# Patient Record
Sex: Male | Born: 1990 | Hispanic: Yes | Marital: Single | Smoking: Never smoker
Health system: Southern US, Community
[De-identification: ages and names within clinical notes are randomized; demographics above are authoritative.]

## PROBLEM LIST (undated history)

## (undated) DIAGNOSIS — Z87442 Personal history of urinary calculi: Secondary | ICD-10-CM

## (undated) DIAGNOSIS — K760 Fatty (change of) liver, not elsewhere classified: Secondary | ICD-10-CM

## (undated) HISTORY — PX: NO PAST SURGERIES: SHX2092

---

## 2020-01-12 ENCOUNTER — Emergency Department: Payer: Self-pay

## 2020-01-12 ENCOUNTER — Other Ambulatory Visit: Payer: Self-pay

## 2020-01-12 DIAGNOSIS — N23 Unspecified renal colic: Secondary | ICD-10-CM | POA: Insufficient documentation

## 2020-01-12 LAB — CBC
HCT: 50.6 % (ref 39.0–52.0)
Hemoglobin: 18 g/dL — ABNORMAL HIGH (ref 13.0–17.0)
MCH: 32.2 pg (ref 26.0–34.0)
MCHC: 35.6 g/dL (ref 30.0–36.0)
MCV: 90.5 fL (ref 80.0–100.0)
Platelets: 167 10*3/uL (ref 150–400)
RBC: 5.59 MIL/uL (ref 4.22–5.81)
RDW: 11.8 % (ref 11.5–15.5)
WBC: 19 10*3/uL — ABNORMAL HIGH (ref 4.0–10.5)
nRBC: 0 % (ref 0.0–0.2)

## 2020-01-12 LAB — COMPREHENSIVE METABOLIC PANEL
ALT: 132 U/L — ABNORMAL HIGH (ref 0–44)
AST: 59 U/L — ABNORMAL HIGH (ref 15–41)
Albumin: 4.7 g/dL (ref 3.5–5.0)
Alkaline Phosphatase: 65 U/L (ref 38–126)
Anion gap: 11 (ref 5–15)
BUN: 15 mg/dL (ref 6–20)
CO2: 25 mmol/L (ref 22–32)
Calcium: 9.7 mg/dL (ref 8.9–10.3)
Chloride: 102 mmol/L (ref 98–111)
Creatinine, Ser: 1.1 mg/dL (ref 0.61–1.24)
GFR calc Af Amer: >60 mL/min (ref >60)
GFR calc non Af Amer: >60 mL/min (ref >60)
Glucose, Bld: 103 mg/dL — ABNORMAL HIGH (ref 70–99)
Potassium: 4.5 mmol/L (ref 3.5–5.1)
Sodium: 138 mmol/L (ref 135–145)
Total Bilirubin: 0.9 mg/dL (ref 0.3–1.2)
Total Protein: 8.1 g/dL (ref 6.5–8.1)

## 2020-01-12 LAB — URINALYSIS, COMPLETE (UACMP) WITH MICROSCOPIC
Bacteria, UA: NONE SEEN
Bilirubin Urine: NEGATIVE
Glucose, UA: NEGATIVE mg/dL
Ketones, ur: NEGATIVE mg/dL
Leukocytes,Ua: NEGATIVE
Nitrite: NEGATIVE
Protein, ur: 30 mg/dL — AB
RBC / HPF: >50 RBC/hpf — ABNORMAL HIGH (ref 0–5)
Specific Gravity, Urine: 1.023 (ref 1.005–1.030)
pH: 6 (ref 5.0–8.0)

## 2020-01-12 LAB — LIPASE, BLOOD: Lipase: 24 U/L (ref 11–51)

## 2020-01-12 NOTE — ED Triage Notes (Signed)
Pt has left side abd pain.  Sx began this am.  No diarrhea.  Vomited x 2 today.  Pt alert  Interpreter on a stick used in triage.

## 2020-01-13 ENCOUNTER — Emergency Department
Admission: EM | Admit: 2020-01-13 | Discharge: 2020-01-13 | Disposition: A | Discharge status: Home / Self Care | Payer: Self-pay | Attending: Emergency Medicine | Admitting: Emergency Medicine

## 2020-01-13 DIAGNOSIS — N23 Unspecified renal colic: Secondary | ICD-10-CM

## 2020-01-13 MED ORDER — OXYCODONE-ACETAMINOPHEN 5-325 MG PO TABS: 1.0000 | ORAL_TABLET | ORAL | 0 refills | Status: DC | PRN

## 2020-01-13 MED ORDER — TAMSULOSIN HCL 0.4 MG PO CAPS
0.4000 mg | ORAL_CAPSULE | Freq: Every day | ORAL | 0 refills | Status: DC
Start: 2020-01-13 — End: 2020-08-25

## 2020-01-13 NOTE — Discharge Instructions (Signed)
Please return for worse pain fever or vomiting.  Follow-up with the urologist the numbers on the chart.  Take Percocet 1 pill 4 times a day as needed for pain.  Be sure not to drive on it or operate hazardous machinery.  Use the Flomax once a day to help the stone pass. Vuelva si tiene nauseas o vomitos. Valla al urologo que le recomendamos en las instrucciones. Tomepercocet 1 pastilla 4 veces al dia si lo necesita para el dolor. Tome flomax una vez al dia para que pase la piedra. No conduzca cuando tome el percocet.

## 2020-01-13 NOTE — ED Provider Notes (Signed)
[facility] Emergency Department Provider Note   ____________________________________________   First MD Initiated Contact with Patient 01/13/20 [id number]     (approximate)  I have reviewed the triage vital signs and the nursing notes.   HISTORY  Chief Complaint Abdominal Pain   HPI Louis Shah is a 29 y.o. male who reports left-sided abdominal pain beginning this morning.  He vomited twice but had no diarrhea.  Pain was severe when he had it but it is gone now.  Is deep and kind of crampy.         No past medical history on file.  There are no problems to display for this patient.     Prior to Admission medications   Medication Sig Start Date End Date Taking? Authorizing Provider  oxyCODONE-acetaminophen (PERCOCET) 5-325 MG tablet Take 1 tablet by mouth every 4 (four) hours as needed for severe pain. 01/13/20 01/12/21  Arnaldo Natal, MD  tamsulosin (FLOMAX) 0.4 MG CAPS capsule Take 1 capsule (0.4 mg total) by mouth daily. 01/13/20   Arnaldo Natal, MD    Allergies Patient has no known allergies.  No family history on file.  Social History Social History   Tobacco Use  . Smoking status: Not on file  Substance Use Topics  . Alcohol use: Not on file  . Drug use: Not on file    Review of Systems  Constitutional: No fever/chills Eyes: No visual changes. ENT: No sore throat. Cardiovascular: Denies chest pain. Respiratory: Denies shortness of breath. Gastrointestinal: Earlier he had abdominal pain, nausea and vomiting.  No diarrhea.  No constipation. Genitourinary: Uncertain about dysuria. Musculoskeletal: Negative for back pain. Skin: Negative for rash. Neurological: Negative for headaches, focal weakness   ____________________________________________   PHYSICAL EXAM:  VITAL SIGNS: ED Triage Vitals  Enc Vitals Group     BP 01/12/20 1934 (!) 150/94     Pulse Rate 01/12/20 1934 63     Resp 01/12/20 1934 20     Temp  01/12/20 1934 99.2 F (37.3 C)     Temp Source 01/12/20 1934 Oral     SpO2 01/12/20 1934 99 %     Weight 01/12/20 1942 183 lb (83 kg)     Height 01/12/20 1942 5\' 6"  (1.676 m)     Head Circumference --      Peak Flow --      Pain Score 01/12/20 1941 5     Pain Loc --      Pain Edu? --      Excl. in GC? --     Constitutional: Alert and oriented. Well appearing and in no acute distress. Eyes: Conjunctivae are normal.  Head: Atraumatic. Nose: No congestion/rhinnorhea. Mouth/Throat: Mucous membranes are moist.  Oropharynx non-erythematous. Neck: No stridor. Cardiovascular: Normal rate, regular rhythm. Grossly normal heart sounds.  Good peripheral circulation. Respiratory: Normal respiratory effort.  No retractions. Lungs CTAB. Gastrointestinal: Soft and nontender. No distention. No abdominal bruits. No CVA tenderness. Musculoskeletal: No lower extremity tenderness nor edema.   Neurologic:  Normal speech and language. No gross focal neurologic deficits are appreciated.  Skin:  Skin is warm, dry and intact. No rash noted.   ____________________________________________   LABS (all labs ordered are listed, but only abnormal results are displayed)  Labs Reviewed  COMPREHENSIVE METABOLIC PANEL - Abnormal; Notable for the following components:      Result Value   Glucose, Bld 103 (*)    AST 59 (*)    ALT 132 (*)  All other components within normal limits  CBC - Abnormal; Notable for the following components:   WBC 19.0 (*)    Hemoglobin 18.0 (*)    All other components within normal limits  URINALYSIS, COMPLETE (UACMP) WITH MICROSCOPIC - Abnormal; Notable for the following components:   Color, Urine YELLOW (*)    APPearance HAZY (*)    Hgb urine dipstick LARGE (*)    Protein, ur 30 (*)    RBC / HPF >50 (*)    All other components within normal limits  LIPASE, BLOOD   ____________________________________________  EKG     ____________________________________________  RADIOLOGY  ED MD interpretation: CT read by radiology and reviewed by me does show a 6 mm kidney stone proximally with hydronephrosis  Official radiology report(s): CT Renal Stone Study  Result Date: 01/12/2020 CLINICAL DATA:  Left-sided abdominal pain, vomiting EXAM: CT ABDOMEN AND PELVIS WITHOUT CONTRAST TECHNIQUE: Multidetector CT imaging of the abdomen and pelvis was performed following the standard protocol without IV contrast. COMPARISON:  None. FINDINGS: Lower chest: No acute pleural or parenchymal lung disease. Hepatobiliary: No focal liver abnormality is seen. No gallstones, gallbladder wall thickening, or biliary dilatation. Pancreas: Unremarkable. No pancreatic ductal dilatation or surrounding inflammatory changes. Spleen: Normal in size without focal abnormality. Adrenals/Urinary Tract: There is high-grade left-sided obstructive uropathy related to a 6 mm obstructing proximal left ureteral calculus, reference image 50. The right kidney is unremarkable. The adrenals are normal. Bladder is minimally distended with no focal abnormality. Stomach/Bowel: No bowel obstruction or ileus. Normal appendix right lower quadrant. No bowel wall thickening or inflammatory change. Vascular/Lymphatic: No significant vascular findings are present. No enlarged abdominal or pelvic lymph nodes. Reproductive: Prostate is unremarkable. Other: No free fluid or free gas. No abdominal wall hernia. Musculoskeletal: No acute or destructive bony lesions. Reconstructed images demonstrate no additional findings. IMPRESSION: 1. High-grade left-sided obstructive uropathy related to a 6 mm proximal left ureteral calculus. Electronically Signed   By: Sharlet Salina M.D.   On: 01/12/2020 22:08    ____________________________________________   PROCEDURES  Procedure(s) performed (including Critical  Care):  Procedures   ____________________________________________   INITIAL IMPRESSION / ASSESSMENT AND PLAN / ED COURSE  Patient is currently pain-free.  CT does show the 6 mm kidney stone proximally.  Likely will not pass it so large.  I will have the patient follow-up with urology as he may need lithotripsy or something else.  I have given him some Flomax and some Percocet to help with the pain.  Patient will return here as instructed if Percocet is not helping him.   I have also instructed him not to drive or operate hazardous machinery on the Percocet.  I should add that the urinalysis shows red cells but no white cells the patient is not febrile and has no bacteria in his urine therefore I do not believe he requires any antibiotics.  Blood pressure was elevated while he had the pain.  We will need to watch this though.  Likely will come down when the stone passes             ____________________________________________   FINAL CLINICAL IMPRESSION(S) / ED DIAGNOSES  Final diagnoses:  Renal colic     ED Discharge Orders         Ordered    tamsulosin (FLOMAX) 0.4 MG CAPS capsule  Daily        01/13/20 0651    oxyCODONE-acetaminophen (PERCOCET) 5-325 MG tablet  Every 4 hours PRN  01/13/20 5956          *Please note:  Hymie Gorr was evaluated in Emergency Department on 01/13/2020 for the symptoms described in the history of present illness. He was evaluated in the context of the global COVID-19 pandemic, which necessitated consideration that the patient might be at risk for infection with the SARS-CoV-2 virus that causes COVID-19. Institutional protocols and algorithms that pertain to the evaluation of patients at risk for COVID-19 are in a state of rapid change based on information released by regulatory bodies including the CDC and federal and state organizations. These policies and algorithms were followed during the patient's care in the ED.  Some ED  evaluations and interventions may be delayed as a result of limited staffing during and the pandemic.*   Note:  This document was prepared using Dragon voice recognition software and may include unintentional dictation errors.    Arnaldo Natal, MD 01/13/20 [phone]

## 2020-01-13 NOTE — ED Notes (Signed)
Patient discharged to home per MD order. Patient in stable condition, and deemed medically cleared by ED provider for discharge. Discharge instructions reviewed with patient/family using "Teach Back"; verbalized understanding of medication education and administration, and information about follow-up care. Denies further concerns. ° °

## 2020-08-25 ENCOUNTER — Emergency Department
Admission: EM | Admit: 2020-08-25 | Discharge: 2020-08-25 | Disposition: A | Discharge status: Home / Self Care | Payer: PRIVATE HEALTH INSURANCE | Attending: Emergency Medicine | Admitting: Emergency Medicine

## 2020-08-25 ENCOUNTER — Emergency Department: Payer: PRIVATE HEALTH INSURANCE

## 2020-08-25 ENCOUNTER — Encounter: Payer: Self-pay | Admitting: Emergency Medicine

## 2020-08-25 ENCOUNTER — Other Ambulatory Visit: Payer: Self-pay

## 2020-08-25 DIAGNOSIS — S060X1A Concussion with loss of consciousness of 30 minutes or less, initial encounter: Secondary | ICD-10-CM | POA: Diagnosis not present

## 2020-08-25 DIAGNOSIS — Z23 Encounter for immunization: Secondary | ICD-10-CM | POA: Insufficient documentation

## 2020-08-25 DIAGNOSIS — S52101A Unspecified fracture of upper end of right radius, initial encounter for closed fracture: Secondary | ICD-10-CM | POA: Insufficient documentation

## 2020-08-25 DIAGNOSIS — S022XXA Fracture of nasal bones, initial encounter for closed fracture: Secondary | ICD-10-CM | POA: Diagnosis not present

## 2020-08-25 DIAGNOSIS — S0181XA Laceration without foreign body of other part of head, initial encounter: Secondary | ICD-10-CM | POA: Diagnosis not present

## 2020-08-25 DIAGNOSIS — W11XXXA Fall on and from ladder, initial encounter: Secondary | ICD-10-CM | POA: Diagnosis not present

## 2020-08-25 DIAGNOSIS — S0990XA Unspecified injury of head, initial encounter: Secondary | ICD-10-CM | POA: Diagnosis present

## 2020-08-25 LAB — COMPREHENSIVE METABOLIC PANEL
ALT: 83 U/L — ABNORMAL HIGH (ref 0–44)
AST: 45 U/L — ABNORMAL HIGH (ref 15–41)
Albumin: 4.8 g/dL (ref 3.5–5.0)
Alkaline Phosphatase: 59 U/L (ref 38–126)
Anion gap: 9 (ref 5–15)
BUN: 21 mg/dL — ABNORMAL HIGH (ref 6–20)
CO2: 25 mmol/L (ref 22–32)
Calcium: 9.3 mg/dL (ref 8.9–10.3)
Chloride: 106 mmol/L (ref 98–111)
Creatinine, Ser: 0.79 mg/dL (ref 0.61–1.24)
GFR, Estimated: >60 mL/min (ref >60)
Glucose, Bld: 94 mg/dL (ref 70–99)
Potassium: 3.8 mmol/L (ref 3.5–5.1)
Sodium: 140 mmol/L (ref 135–145)
Total Bilirubin: 1 mg/dL (ref 0.3–1.2)
Total Protein: 7.9 g/dL (ref 6.5–8.1)

## 2020-08-25 LAB — CBC WITH DIFFERENTIAL/PLATELET
Abs Immature Granulocytes: 0.05 10*3/uL (ref 0.00–0.07)
Basophils Absolute: 0 10*3/uL (ref 0.0–0.1)
Basophils Relative: 0 %
Eosinophils Absolute: 0.1 10*3/uL (ref 0.0–0.5)
Eosinophils Relative: 1 %
HCT: 48.2 % (ref 39.0–52.0)
Hemoglobin: 16.9 g/dL (ref 13.0–17.0)
Immature Granulocytes: 0 %
Lymphocytes Relative: 14 %
Lymphs Abs: 1.7 10*3/uL (ref 0.7–4.0)
MCH: 30.3 pg (ref 26.0–34.0)
MCHC: 35.1 g/dL (ref 30.0–36.0)
MCV: 86.5 fL (ref 80.0–100.0)
Monocytes Absolute: 0.7 10*3/uL (ref 0.1–1.0)
Monocytes Relative: 6 %
Neutro Abs: 9.9 10*3/uL — ABNORMAL HIGH (ref 1.7–7.7)
Neutrophils Relative %: 79 %
Platelets: 150 10*3/uL (ref 150–400)
RBC: 5.57 MIL/uL (ref 4.22–5.81)
RDW: 12.3 % (ref 11.5–15.5)
WBC: 12.4 10*3/uL — ABNORMAL HIGH (ref 4.0–10.5)
nRBC: 0 % (ref 0.0–0.2)

## 2020-08-25 MED ORDER — ONDANSETRON HCL 4 MG/2ML IJ SOLN
4.0000 mg | Freq: Once | INTRAMUSCULAR | Status: AC
  Administered 2020-08-25: 4 mg via INTRAVENOUS
  Filled 2020-08-25: qty 2

## 2020-08-25 MED ORDER — LIDOCAINE-EPINEPHRINE 2 %-1:100000 IJ SOLN: 20.0000 mL | Freq: Once | INTRAMUSCULAR | Status: DC

## 2020-08-25 MED ORDER — FENTANYL CITRATE (PF) 100 MCG/2ML IJ SOLN
50.0000 ug | Freq: Once | INTRAMUSCULAR | Status: AC
  Administered 2020-08-25: 50 ug via INTRAVENOUS
  Filled 2020-08-25: qty 2

## 2020-08-25 MED ORDER — OXYCODONE HCL 5 MG PO TABS: 5.0000 mg | ORAL_TABLET | Freq: Four times a day (QID) | ORAL | 0 refills | Status: AC | PRN

## 2020-08-25 MED ORDER — LIDOCAINE-EPINEPHRINE 2 %-1:100000 IJ SOLN
20.0000 mL | Freq: Once | INTRAMUSCULAR | Status: AC
  Administered 2020-08-25: 20 mL via INTRADERMAL

## 2020-08-25 MED ORDER — TETANUS-DIPHTH-ACELL PERTUSSIS 5-2.5-18.5 LF-MCG/0.5 IM SUSY
0.5000 mL | PREFILLED_SYRINGE | Freq: Once | INTRAMUSCULAR | Status: AC
  Administered 2020-08-25: 0.5 mL via INTRAMUSCULAR
  Filled 2020-08-25: qty 0.5

## 2020-08-25 NOTE — ED Triage Notes (Signed)
Pt comes into the ED via POV c/o a fall from the roof where he was on a 60ft ladder.  Pt states that he did land on concrete and hit his head.  Pt denies any LOC.  Pt also c/o laceration to the forehead and right wrist pain.  Pt in NAD at is neurologically intact at this time.  Pt denies any neck or back pain currently.  No obvious deformity noted.

## 2020-08-25 NOTE — Discharge Instructions (Addendum)
He mostly has a mild concussion.  Do not do any sports.  Take Tylenol 1 g every 8 hours for pain and use oxycodone for breakthrough pain.  Sutures need removed in 5 days  You have incidentally noted liver function test elevation that you have had previously.  This needs to be followed up with a primary doctor  Nasal fracture:  Minimally displaced LEFT nasal bone fracture. Do not blow your nose.  Follow-up with ENT in 6-10  Wrist fracture: Stay in the splint.  Follow-up with orthopedics IMPRESSION: 1. Acute nondisplaced distal radial fracture with suspected intra-articular extension. 2. Acute minimally displaced triquetral avulsion fracture.  Take oxycodone as prescribed. Do not drink alcohol, drive or participate in any other potentially dangerous activities while taking this medication as it may make you sleepy. Do not take this medication with any other sedating medications, either prescription or over-the-counter. If you were prescribed Percocet or Vicodin, do not take these with acetaminophen (Tylenol) as it is already contained within these medications.  This medication is an opiate (or narcotic) pain medication and can be habit forming. Use it as little as possible to achieve adequate pain control. Do not use or use it with extreme caution if you have a history of opiate abuse or dependence. If you are on a pain contract with your primary care doctor or a pain specialist, be sure to let them know you were prescribed this medication today from the [facility] Emergency Department. This medication is intended for your use only - do not give any to anyone else and keep it in a secure place where nobody else, especially children, have access to it.

## 2020-08-25 NOTE — ED Provider Notes (Addendum)
[facility] Emergency Department Provider Note  ____________________________________________   Event Date/Time   First MD Initiated Contact with Patient 08/25/20 1147     (approximate)  I have reviewed the triage vital signs and the nursing notes.   HISTORY  Chief Complaint Fall    HPI Louis Shah is a 30 y.o. male otherwise healthy comes in for a fall.  Patient was on a ladder when the ladder slid to the right and he fell down onto the ground.  He is unsure exactly how he landed but is only reporting pain in his right lower shin and right wrist and his head.  Possible LOC. Doesn't remember event well.  He denies any chest wall pain, abdominal pain, back pain.  Has been ambulatory since the fall.  He has a small laceration above his right eye.  The pain is constant, worse with movement especially of his wrist, nothing makes it better.  Unsure of his last tetanus     Medical: None   There are no problems to display for this patient.    Prior to Admission medications   Medication Sig Start Date End Date Taking? Authorizing Provider  oxyCODONE-acetaminophen (PERCOCET) 5-325 MG tablet Take 1 tablet by mouth every 4 (four) hours as needed for severe pain. 01/13/20 01/12/21  Arnaldo Natal, MD  tamsulosin (FLOMAX) 0.4 MG CAPS capsule Take 1 capsule (0.4 mg total) by mouth daily. 01/13/20   Arnaldo Natal, MD    Allergies Patient has no known allergies.  No family history on file.  Social History Social History   Tobacco Use  . Smoking status: Never Smoker  . Smokeless tobacco: Never Used  Substance Use Topics  . Alcohol use: Yes    Comment: socially      Review of Systems Constitutional: No fever/chills Eyes: No visual changes. ENT: No sore throat.  Facial injury Cardiovascular: Denies chest pain. Respiratory: Denies shortness of breath. Gastrointestinal: No abdominal pain.  No nausea, no vomiting.  No diarrhea.  No  constipation. Genitourinary: Negative for dysuria. Musculoskeletal: Negative for back pain.  Wrist pain, leg pain Skin: Negative for rash. Neurological: Negative for headaches, focal weakness or numbness. All other ROS negative ____________________________________________   PHYSICAL EXAM:  VITAL SIGNS: ED Triage Vitals  Enc Vitals Group     BP 08/25/20 1143 (!) 130/91     Pulse Rate 08/25/20 1143 85     Resp 08/25/20 1143 18     Temp 08/25/20 1143 98.1 F (36.7 C)     Temp Source 08/25/20 1143 Oral     SpO2 08/25/20 1143 98 %     Weight 08/25/20 1144 180 lb (81.6 kg)     Height 08/25/20 1144 5' 2.99" (1.6 m)     Head Circumference --      Peak Flow --      Pain Score 08/25/20 1143 6     Pain Loc --      Pain Edu? --      Excl. in GC? --     Constitutional: Alert and oriented. Well appearing and in no acute distress. Eyes: Conjunctivae are normal. EOMI. Head: Laceration above the right eye. Nose: No congestion/rhinnorhea. Mouth/Throat: Mucous membranes are moist.   Neck: No stridor. Trachea Midline. FROM Cardiovascular: Normal rate, regular rhythm. Grossly normal heart sounds.  Good peripheral circulation.  No chest wall tenderness Respiratory: Normal respiratory effort.  No retractions. Lungs CTAB. Gastrointestinal: Soft and nontender. No distention. No abdominal bruits.  No abdominal tenderness Musculoskeletal: Small abrasion on the right lower shin.  Full range of motion of joints.  Some tenderness at the right wrist with 2+ distal pulse. Neurologic:  Normal speech and language. No gross focal neurologic deficits are appreciated.  Skin:  Skin is warm, dry and intact. No rash noted. Psychiatric: Mood and affect are normal. Speech and behavior are normal. GU: Deferred  Back: No CTL spine tenderness ____________________________________________   LABS (all labs ordered are listed, but only abnormal results are displayed)  Labs Reviewed  CBC WITH DIFFERENTIAL/PLATELET  - Abnormal; Notable for the following components:      Result Value   WBC 12.4 (*)    Neutro Abs 9.9 (*)    All other components within normal limits  COMPREHENSIVE METABOLIC PANEL - Abnormal; Notable for the following components:   BUN 21 (*)    AST 45 (*)    ALT 83 (*)    All other components within normal limits   ____________________________________________   ED ECG REPORT I, Concha Se, the attending physician, personally viewed and interpreted this ECG.  Normal sinus rate of 97, no ST elevation, T wave version in lead III, normal intervals ____________________________________________  RADIOLOGY Vela Prose, personally viewed and evaluated these images (plain radiographs) as part of my medical decision making, as well as reviewing the written report by the radiologist.  ED MD interpretation: Fracture of wrist  Official radiology report(s): DG Wrist Complete Right  Result Date: 08/25/2020 CLINICAL DATA:  Fall EXAM: RIGHT WRIST - COMPLETE 3+ VIEW COMPARISON:  None. FINDINGS: Acute nondisplaced fracture of the distal radial metaphysis with suspected intra-articular extension at the level of the lunate fossa. Minimally displaced avulsion fracture from the dorsal triquetrum. Osseous structures are otherwise intact. No dislocation. Diffuse soft tissue swelling. IMPRESSION: 1. Acute nondisplaced distal radial fracture with suspected intra-articular extension. 2. Acute minimally displaced triquetral avulsion fracture. Electronically Signed   By: Duanne Guess D.O.   On: 08/25/2020 12:52   DG Tibia/Fibula Right  Result Date: 08/25/2020 CLINICAL DATA:  Fall EXAM: RIGHT TIBIA AND FIBULA - 2 VIEW COMPARISON:  None FINDINGS: Osseous mineralization normal. Joint spaces preserved. No fracture, dislocation, or bone destruction. IMPRESSION: Normal exam. Electronically Signed   By: Ulyses Southward M.D.   On: 08/25/2020 12:51   CT Head Wo Contrast  Result Date: 08/25/2020 CLINICAL DATA:  Larey Seat  from roof from an 8 foot ladder, landed on concrete, struck head, denies loss of consciousness, has frontal laceration, facial trauma, denies neck pain EXAM: CT HEAD WITHOUT CONTRAST CT MAXILLOFACIAL WITHOUT CONTRAST CT CERVICAL SPINE WITHOUT CONTRAST TECHNIQUE: Multidetector CT imaging of the head, cervical spine, and maxillofacial structures were performed using the standard protocol without intravenous contrast. Multiplanar CT image reconstructions of the cervical spine and maxillofacial structures were also generated. Right side of face marked with BB. COMPARISON:  None FINDINGS: CT HEAD FINDINGS Brain: Normal ventricular morphology. No midline shift or mass effect. Normal appearance of brain parenchyma. No intracranial hemorrhage, mass lesion, or evidence of acute infarction. No extra-axial fluid collections. Vascular: No vascular abnormalities Skull: Minimal RIGHT frontal scalp soft tissue swelling. Calvaria intact. Other: N/A CT MAXILLOFACIAL FINDINGS Osseous: Minimally displaced LEFT nasal bone fracture. No additional facial bone fractures identified. TMJ alignment normal bilaterally. Orbits: Bony orbits intact. Intraorbital soft tissue planes clear without fluid or air. Sinuses: Scattered mucosal thickening in ethmoid air cells. Small mucosal retention cyst RIGHT maxillary sinus with small RIGHT maxillary air-fluid level. Mastoid air cells  remaining paranasal sinuses, and middle ear cavities clear. Soft tissues: Small RIGHT supraorbital laceration and soft tissue swelling/contusion. Soft tissue swelling at the nose. No additional soft tissue abnormalities. CT CERVICAL SPINE FINDINGS Alignment: Normal Skull base and vertebrae: Osseous mineralization normal. Vertebral body and disc space heights maintained. Skull base intact. No fracture, subluxation, or bone destruction. Soft tissues and spinal canal: Prevertebral soft tissues normal thickness. Remaining visualized cervical soft tissues unremarkable. Disc  levels:  No specific abnormalities Upper chest: Lung apices clear. Other: N/A IMPRESSION: Normal CT head. Minimally displaced LEFT nasal bone fracture. No additional facial bone abnormalities. Normal CT cervical spine. Electronically Signed   By: Ulyses SouthwardMark  Boles M.D.   On: 08/25/2020 13:18   CT Cervical Spine Wo Contrast  Result Date: 08/25/2020 CLINICAL DATA:  Larey SeatFell from roof from an 8 foot ladder, landed on concrete, struck head, denies loss of consciousness, has frontal laceration, facial trauma, denies neck pain EXAM: CT HEAD WITHOUT CONTRAST CT MAXILLOFACIAL WITHOUT CONTRAST CT CERVICAL SPINE WITHOUT CONTRAST TECHNIQUE: Multidetector CT imaging of the head, cervical spine, and maxillofacial structures were performed using the standard protocol without intravenous contrast. Multiplanar CT image reconstructions of the cervical spine and maxillofacial structures were also generated. Right side of face marked with BB. COMPARISON:  None FINDINGS: CT HEAD FINDINGS Brain: Normal ventricular morphology. No midline shift or mass effect. Normal appearance of brain parenchyma. No intracranial hemorrhage, mass lesion, or evidence of acute infarction. No extra-axial fluid collections. Vascular: No vascular abnormalities Skull: Minimal RIGHT frontal scalp soft tissue swelling. Calvaria intact. Other: N/A CT MAXILLOFACIAL FINDINGS Osseous: Minimally displaced LEFT nasal bone fracture. No additional facial bone fractures identified. TMJ alignment normal bilaterally. Orbits: Bony orbits intact. Intraorbital soft tissue planes clear without fluid or air. Sinuses: Scattered mucosal thickening in ethmoid air cells. Small mucosal retention cyst RIGHT maxillary sinus with small RIGHT maxillary air-fluid level. Mastoid air cells remaining paranasal sinuses, and middle ear cavities clear. Soft tissues: Small RIGHT supraorbital laceration and soft tissue swelling/contusion. Soft tissue swelling at the nose. No additional soft tissue  abnormalities. CT CERVICAL SPINE FINDINGS Alignment: Normal Skull base and vertebrae: Osseous mineralization normal. Vertebral body and disc space heights maintained. Skull base intact. No fracture, subluxation, or bone destruction. Soft tissues and spinal canal: Prevertebral soft tissues normal thickness. Remaining visualized cervical soft tissues unremarkable. Disc levels:  No specific abnormalities Upper chest: Lung apices clear. Other: N/A IMPRESSION: Normal CT head. Minimally displaced LEFT nasal bone fracture. No additional facial bone abnormalities. Normal CT cervical spine. Electronically Signed   By: Ulyses SouthwardMark  Boles M.D.   On: 08/25/2020 13:18   CT Maxillofacial Wo Contrast  Result Date: 08/25/2020 CLINICAL DATA:  Larey SeatFell from roof from an 8 foot ladder, landed on concrete, struck head, denies loss of consciousness, has frontal laceration, facial trauma, denies neck pain EXAM: CT HEAD WITHOUT CONTRAST CT MAXILLOFACIAL WITHOUT CONTRAST CT CERVICAL SPINE WITHOUT CONTRAST TECHNIQUE: Multidetector CT imaging of the head, cervical spine, and maxillofacial structures were performed using the standard protocol without intravenous contrast. Multiplanar CT image reconstructions of the cervical spine and maxillofacial structures were also generated. Right side of face marked with BB. COMPARISON:  None FINDINGS: CT HEAD FINDINGS Brain: Normal ventricular morphology. No midline shift or mass effect. Normal appearance of brain parenchyma. No intracranial hemorrhage, mass lesion, or evidence of acute infarction. No extra-axial fluid collections. Vascular: No vascular abnormalities Skull: Minimal RIGHT frontal scalp soft tissue swelling. Calvaria intact. Other: N/A CT MAXILLOFACIAL FINDINGS Osseous: Minimally displaced LEFT nasal  bone fracture. No additional facial bone fractures identified. TMJ alignment normal bilaterally. Orbits: Bony orbits intact. Intraorbital soft tissue planes clear without fluid or air. Sinuses:  Scattered mucosal thickening in ethmoid air cells. Small mucosal retention cyst RIGHT maxillary sinus with small RIGHT maxillary air-fluid level. Mastoid air cells remaining paranasal sinuses, and middle ear cavities clear. Soft tissues: Small RIGHT supraorbital laceration and soft tissue swelling/contusion. Soft tissue swelling at the nose. No additional soft tissue abnormalities. CT CERVICAL SPINE FINDINGS Alignment: Normal Skull base and vertebrae: Osseous mineralization normal. Vertebral body and disc space heights maintained. Skull base intact. No fracture, subluxation, or bone destruction. Soft tissues and spinal canal: Prevertebral soft tissues normal thickness. Remaining visualized cervical soft tissues unremarkable. Disc levels:  No specific abnormalities Upper chest: Lung apices clear. Other: N/A IMPRESSION: Normal CT head. Minimally displaced LEFT nasal bone fracture. No additional facial bone abnormalities. Normal CT cervical spine. Electronically Signed   By: Ulyses Southward M.D.   On: 08/25/2020 13:18    ____________________________________________   PROCEDURES  Procedure(s) performed (including Critical Care):  Marland KitchenMarland KitchenLaceration Repair  Date/Time: 08/25/2020 2:06 PM Performed by: Concha Se, MD Authorized by: Concha Se, MD   Consent:    Consent obtained:  Verbal   Consent given by:  Patient   Risks, benefits, and alternatives were discussed: yes     Risks discussed:  Infection, nerve damage, need for additional repair, poor cosmetic result, poor wound healing, retained foreign body, tendon damage, vascular damage and pain   Alternatives discussed:  No treatment Universal protocol:    Patient identity confirmed:  Verbally with patient Anesthesia:    Anesthesia method:  Local infiltration   Local anesthetic:  Lidocaine 1% WITH epi Laceration details:    Location:  Face   Face location:  Forehead   Length (cm):  8   Depth (mm):  5 Pre-procedure details:    Preparation:   Patient was prepped and draped in usual sterile fashion Exploration:    Limited defect created (wound extended): no     Hemostasis achieved with:  Epinephrine   Imaging outcome: foreign body not noted     Contaminated: no   Treatment:    Area cleansed with:  Povidone-iodine and saline   Amount of cleaning:  Standard   Debridement:  None Skin repair:    Repair method:  Sutures   Suture size:  6-0   Suture material:  Prolene   Number of sutures:  8 Approximation:    Approximation:  Close     ____________________________________________   INITIAL IMPRESSION / ASSESSMENT AND PLAN / ED COURSE  Louis Shah was evaluated in Emergency Department on 08/25/2020 for the symptoms described in the history of present illness. He was evaluated in the context of the global COVID-19 pandemic, which necessitated consideration that the patient might be at risk for infection with the SARS-CoV-2 virus that causes COVID-19. Institutional protocols and algorithms that pertain to the evaluation of patients at risk for COVID-19 are in a state of rapid change based on information released by regulatory bodies including the CDC and federal and state organizations. These policies and algorithms were followed during the patient's care in the ED.    Patient comes in with significant fall from a ladder.  Differential includes intracranial hemorrhage, cervical fracture, facial fracture, extremity fracture.  No chest wall tenderness or abdominal pain to suggest intrathoracic or abdominal injuries but discussed the patient that if he develops any pain to let us know.  Will get EKG given possible LOC.  We will give some IV fentanyl and Zofran to help treat symptoms   Patient does have slightly increased liver function test but has had this previously.  We will have patient follow this up with his primary care doctor.  2:07 PM evaluation patient has abdomen remains soft nontender remains to have no chest wall  tenderness.  CT scan showed minimally displaced left nasal bone fracture.  Patient has no septal hematoma.  Patient does report some amnesia to the event therefore I suspect that he had a mild concussion.  He is currently alert and oriented  Sutures placed and recommended them removed in 5 days.  Splint placed I did discuss with Hyacinth Meeker from orthopedics who will follow up with them in clinic.  We discussed with patient Tylenol and oxycodone for breakthrough pain and not driving while on it.  Patient also understands to follow-up with ENT for his nasal fracture   Spanish interpreter was used  ____________________________________________   FINAL CLINICAL IMPRESSION(S) / ED DIAGNOSES   Final diagnoses:  Closed fracture of nasal bone, initial encounter  Closed fracture of proximal end of right radius, unspecified fracture morphology, initial encounter  Laceration of forehead, initial encounter  Concussion with loss of consciousness of 30 minutes or less, initial encounter      MEDICATIONS GIVEN DURING THIS VISIT:  Medications  fentaNYL (SUBLIMAZE) injection 50 mcg (50 mcg Intravenous Given 08/25/20 1245)  ondansetron (ZOFRAN) injection 4 mg (4 mg Intravenous Given 08/25/20 1245)  Tdap (BOOSTRIX) injection 0.5 mL (0.5 mLs Intramuscular Given 08/25/20 1244)  lidocaine-EPINEPHrine (XYLOCAINE W/EPI) 2 %-1:100000 (with pres) injection 20 mL (20 mLs Intradermal Given by Other 08/25/20 1311)     ED Discharge Orders    None       Note:  This document was prepared using Dragon voice recognition software and may include unintentional dictation errors.   Concha Se, MD 08/25/20 1409    Concha Se, MD 08/25/20 [phone]

## 2020-08-25 NOTE — ED Notes (Signed)
See triage note  Presents s/p fall from ladder  States he landed on concrete  Laceration/abrasions noted to forehead and nasal area  Also having pain to right wrist area  Good pulses

## 2020-08-31 ENCOUNTER — Emergency Department
Admission: EM | Admit: 2020-08-31 | Discharge: 2020-08-31 | Disposition: A | Discharge status: Home / Self Care | Payer: Self-pay | Attending: Student in an Organized Health Care Education/Training Program | Admitting: Student in an Organized Health Care Education/Training Program

## 2020-08-31 ENCOUNTER — Other Ambulatory Visit: Payer: Self-pay

## 2020-08-31 DIAGNOSIS — Z4802 Encounter for removal of sutures: Secondary | ICD-10-CM | POA: Insufficient documentation

## 2020-08-31 DIAGNOSIS — S01111D Laceration without foreign body of right eyelid and periocular area, subsequent encounter: Secondary | ICD-10-CM | POA: Insufficient documentation

## 2020-08-31 DIAGNOSIS — X58XXXD Exposure to other specified factors, subsequent encounter: Secondary | ICD-10-CM | POA: Insufficient documentation

## 2020-08-31 NOTE — Discharge Instructions (Signed)
Continue to keep area clean and dry.

## 2020-08-31 NOTE — ED Triage Notes (Signed)
Pt here for staple removal from previous visit to ED. Pt A&O with no complaints.

## 2020-08-31 NOTE — ED Provider Notes (Signed)
[facility] Emergency Department Provider Note  ____________________________________________   Event Date/Time   First MD Initiated Contact with Patient 08/31/20 1044     (approximate)  I have reviewed the triage vital signs and the nursing notes.   HISTORY  Chief Complaint Suture / Staple Removal   HPI Louis Shah is a 30 y.o. male presents to the ED for suture removal.  Patient denies any difficulties.       No past medical history on file.  There are no problems to display for this patient.   Prior to Admission medications   Medication Sig Start Date End Date Taking? Authorizing Provider  oxyCODONE (ROXICODONE) 5 MG immediate release tablet Take 1 tablet (5 mg total) by mouth every 6 (six) hours as needed for up to 7 days. Patient taking differently: Take 5 mg by mouth every 6 (six) hours as needed (pain). 08/25/20 09/01/20  Concha Se, MD    Allergies Patient has no known allergies.  No family history on file.  Social History Social History   Tobacco Use  . Smoking status: Never Smoker  . Smokeless tobacco: Never Used  Substance Use Topics  . Alcohol use: Yes    Comment: socially    Review of Systems Constitutional: No fever/chills Eyes: No visual changes. ENT: No sore throat. Cardiovascular: Denies chest pain. Respiratory: Denies shortness of breath. Musculoskeletal: Negative for skeletal pain. Skin: Sutures. Neurological: Negative for headaches, focal weakness or numbness. ____________________________________________   PHYSICAL EXAM:  VITAL SIGNS: ED Triage Vitals  Enc Vitals Group     BP      Pulse      Resp      Temp      Temp src      SpO2      Weight      Height      Head Circumference      Peak Flow      Pain Score      Pain Loc      Pain Edu?      Excl. in GC?     Constitutional: Alert and oriented. Well appearing and in no acute distress. Eyes: Conjunctivae are normal. PERRL. EOMI. Head:  Atraumatic. Neck: No stridor.   Cardiovascular:  Good peripheral circulation. Respiratory: Normal respiratory effort.  No retractions.  Neurologic:  Normal speech and language. No gross focal neurologic deficits are appreciated. No gait instability. Skin:  Skin is warm, dry and intact.  Well-healed laceration site right upper eyebrow.  No drainage or erythema present. Psychiatric: Mood and affect are normal. Speech and behavior are normal.  ____________________________________________   LABS (all labs ordered are listed, but only abnormal results are displayed)  Labs Reviewed - No data to display  PROCEDURES  Procedure(s) performed (including Critical Care):  .Suture Removal  Date/Time: 08/31/2020 12:00 PM Performed by: Tommi Rumps, PA-C Authorized by: Tommi Rumps, PA-C   Consent:    Consent obtained:  Verbal   Consent given by:  Patient Location:    Location:  Head/neck   Head/neck location:  Eyebrow   Eyebrow location:  R eyebrow Procedure details:    Wound appearance:  No signs of infection   Number of sutures removed:  8   Number of staples removed:  8 Post-procedure details:    Post-removal:  No dressing applied   Procedure completion:  Tolerated     ____________________________________________   INITIAL IMPRESSION / ASSESSMENT AND PLAN / ED COURSE  As part of my medical decision making, I reviewed the following data within the electronic MEDICAL RECORD NUMBER Notes from prior ED visits and [place] Controlled Substance Database  30 year old male presents to the ED for suture removal.  Patient was seen in the emergency department on 08/25/2020 for laceration above his right eyebrow.  Area appears to be healing well without any signs of infection.  Sutures were removed.  Patient to continue to keep area clean and dry.  ____________________________________________   FINAL CLINICAL IMPRESSION(S) / ED DIAGNOSES  Final diagnoses:  Encounter for removal of  sutures     ED Discharge Orders    None      *Please note:  Glenwood Revoir was evaluated in Emergency Department on 08/31/2020 for the symptoms described in the history of present illness. He was evaluated in the context of the global COVID-19 pandemic, which necessitated consideration that the patient might be at risk for infection with the SARS-CoV-2 virus that causes COVID-19. Institutional protocols and algorithms that pertain to the evaluation of patients at risk for COVID-19 are in a state of rapid change based on information released by regulatory bodies including the CDC and federal and state organizations. These policies and algorithms were followed during the patient's care in the ED.  Some ED evaluations and interventions may be delayed as a result of limited staffing during and the pandemic.*   Note:  This document was prepared using Dragon voice recognition software and may include unintentional dictation errors.    Tommi Rumps, PA-C 08/31/20 1203    Willy Eddy, MD 08/31/20 1447

## 2020-09-01 ENCOUNTER — Encounter
Admission: RE | Admit: 2020-09-01 | Discharge: 2020-09-01 | Disposition: A | Discharge status: Home / Self Care | Payer: Self-pay | Source: Ambulatory Visit | Attending: Otolaryngology | Admitting: Otolaryngology

## 2020-09-01 ENCOUNTER — Other Ambulatory Visit: Payer: Self-pay

## 2020-09-01 HISTORY — DX: Personal history of urinary calculi: Z87.442

## 2020-09-01 HISTORY — DX: Fatty (change of) liver, not elsewhere classified: K76.0

## 2020-09-01 NOTE — Patient Instructions (Signed)
Your procedure is scheduled on: 09-05-20 TUESDAY Su procedimiento est programado para: Report to MEDICAL MALL REGISTRATION DESK  Augustin Coupe a: To find out your arrival time please call [phone] between 1PM - 3PM on 09-04-20 MONDAY Para saber su hora de llegada por favor llame al [phone] entre la 1PM - 3PM el da:   Remember: Instructions that are not followed completely may result in serious medical risk, up to and including death,  or upon the discretion of your surgeon and anesthesiologist your surgery may need to be rescheduled.  Recuerde: Las instrucciones que no se siguen completamente Armed forces logistics/support/administrative officer en un riesgo de salud grave, incluyendo hasta  la [place] o a discrecin de su cirujano y Scientific laboratory technician, su ciruga se puede posponer.   __X_ 1.Do not eat food after midnight the night before your procedure. No    gum chewing or hard candies. You may drink clear liquids up to 2 hours     before you are scheduled to arrive for your surgery- DO not drink clear     Liquids within 2 hours of the start of your surgery.     Clear Liquids include:    water, apple juice without pulp, clear carbohydrate drink such as    Gartorade, Black Coffee or Tea (Do not add anything to coffee or tea).      No coma nada despus de la medianoche de la noche anterior a su    procedimiento. No coma chicles ni caramelos duros. Puede tomar    lquidos claros hasta 2 horas antes de su hora programada de llegada al     hospital para su procedimiento. No tome lquidos claros durante el     transcurso de las 2 horas de su llegada programada al hospital para su     procedimiento, ya que esto puede llevar a que su procedimiento se    retrase o tenga que volver a Magazine features editor.  Los lquidos claros incluyen:          - Agua o jugo de [place] sin pulpa          - Bebidas claras con carbohidratos como Gatorade          - Caf negro o t claro (sin leche, sin cremas, no agregue nada al caf ni al t)  No tome  nada que no est en esta lista.  Los pacientes con diabetes tipo 1 y tipo 2 solo deben Printmaker.  Llame a la clnica de PreCare o a la unidad de Same Day Surgery si  tiene alguna pregunta sobre estas instrucciones.              _X__ 2.Do Not Smoke or use e-cigarettes For 24 Hours Prior to Your Surgery.    Do not use any chewable tobacco products for at least 6   hours prior to surgery.    No fume ni use cigarrillos electrnicos durante las 24 horas previas    a su [place].  No use ningn producto de tabaco masticable durante   al menos 6 horas antes de la [place].     __X_ 3. No alcohol for 24 hours before or after surgery.    No tome alcohol durante las 24 horas antes ni despus de la [place].   ____4. Bring all medications with you on the day of surgery if instructed.    Lleve todos los medicamentos con usted el da de su ciruga si se le    ha indicado as.   ____  5. Notify your doctor if there is any change in your medical condition (cold,fever, infections).    Informe a su mdico si hay algn cambio en su condicin mdica  (resfriado, fiebre, infecciones).   Do not wear jewelry, make-up, hairpins, clips or nail polish.  No use joyas, maquillajes, pinzas/ganchos para el cabello ni esmalte de uas.  Do not wear lotions, powders, or perfumes. You may wear deodorant.  No use lociones, polvos o perfumes.  Puede usar desodorante.    Do not shave 48 hours prior to surgery. Men may shave face and neck.  No se afeite 48 horas antes de la [place].  Los hombres pueden [employer] cara  y el cuello.   Do not bring valuables to the hospital.   No lleve objetos de valor al hospital.  [facility] is not responsible for any belongings or valuables.  [facility] no se hace responsable de ningn tipo de pertenencias u objetos de Licensed conveyancer.               Contacts, dentures or bridgework may not be worn into surgery.  Los lentes de [place], las dentaduras postizas o puentes no se pueden usar  en la [place].   Leave your suitcase in the car. After surgery it may be brought to your room.  Deje su maleta en el auto.  Despus de la ciruga podr traerla a su habitacin.   For patients admitted to the hospital, discharge time is determined by your  treatment team.  Para los pacientes que sean ingresados al hospital, el tiempo en el cual se le  dar de alta es determinado por su equipo de [place].   Patients discharged the day of surgery will not be allowed to drive home. A los pacientes que se les da de alta el mismo da de la ciruga no se les permitir conducir a Higher education careers adviser.   Please read over the following fact sheets that you were given: Por favor lea las siguientes hojas de informacin que le dieron:     _X___ Take these medicines the morning of surgery with A SIP OF WATER:          [place] medicinas la maana de la ciruga con UN SORBO DE AGUA:  1. YOU MAY TAKE OXYCODONE THE DAY OF SURGERY IF NEEDED  2.   3.   4.       5.  6.   _X___ Stop Anti-inflammatories NOW-DO NOT TAKE ADVIL, IBUPROFEN, ALEVE, NAPROXEN OR ANY ASPIRIN PRODUCTS-OK TO CONTINUE OXYCODONE OR TYLENOL IF NEEDED          Deje de tomar antiinflamatorios el da:

## 2020-09-05 ENCOUNTER — Encounter
Admission: RE | Disposition: A | Discharge status: Home / Self Care | Payer: Self-pay | Source: Home / Self Care | Attending: Otolaryngology

## 2020-09-05 ENCOUNTER — Encounter: Payer: Self-pay | Admitting: Otolaryngology

## 2020-09-05 ENCOUNTER — Ambulatory Visit: Payer: PRIVATE HEALTH INSURANCE | Admitting: Anesthesiology

## 2020-09-05 ENCOUNTER — Other Ambulatory Visit: Payer: Self-pay

## 2020-09-05 ENCOUNTER — Ambulatory Visit
Admission: RE | Admit: 2020-09-05 | Discharge: 2020-09-05 | Disposition: A | Discharge status: Home / Self Care | Payer: PRIVATE HEALTH INSURANCE | Attending: Otolaryngology | Admitting: Otolaryngology

## 2020-09-05 DIAGNOSIS — S06899A Other specified intracranial injury with loss of consciousness of unspecified duration, initial encounter: Secondary | ICD-10-CM | POA: Diagnosis not present

## 2020-09-05 DIAGNOSIS — S022XXA Fracture of nasal bones, initial encounter for closed fracture: Secondary | ICD-10-CM | POA: Diagnosis not present

## 2020-09-05 DIAGNOSIS — J342 Deviated nasal septum: Secondary | ICD-10-CM | POA: Diagnosis present

## 2020-09-05 DIAGNOSIS — W11XXXA Fall on and from ladder, initial encounter: Secondary | ICD-10-CM | POA: Insufficient documentation

## 2020-09-05 HISTORY — PX: CLOSED REDUCTION NASAL FRACTURE: SHX5365

## 2020-09-05 HISTORY — PX: SEPTOPLASTY: SHX2393

## 2020-09-05 SURGERY — CLOSED REDUCTION, FRACTURE, NASAL BONE
Anesthesia: General

## 2020-09-05 SURGERY — CLOSED REDUCTION, FRACTURE, NASAL BONE
Anesthesia: General | Site: Nose

## 2020-09-05 MED ORDER — OXYCODONE HCL 5 MG/5ML PO SOLN: 5.0000 mg | Freq: Once | ORAL | Status: AC | PRN

## 2020-09-05 MED ORDER — CHLORHEXIDINE GLUCONATE 0.12 % MT SOLN
OROMUCOSAL | Status: AC
  Administered 2020-09-05: 15 mL via OROMUCOSAL
  Filled 2020-09-05: qty 15

## 2020-09-05 MED ORDER — FENTANYL CITRATE (PF) 100 MCG/2ML IJ SOLN
INTRAMUSCULAR | Status: AC
  Filled 2020-09-05: qty 2

## 2020-09-05 MED ORDER — DEXMEDETOMIDINE (PRECEDEX) IN NS 20 MCG/5ML (4 MCG/ML) IV SYRINGE
PREFILLED_SYRINGE | INTRAVENOUS | Status: DC | PRN
  Administered 2020-09-05: 20 ug via INTRAVENOUS

## 2020-09-05 MED ORDER — CEFAZOLIN SODIUM-DEXTROSE 2-4 GM/100ML-% IV SOLN
INTRAVENOUS | Status: AC
  Filled 2020-09-05: qty 100

## 2020-09-05 MED ORDER — ACETAMINOPHEN 10 MG/ML IV SOLN
INTRAVENOUS | Status: AC
  Filled 2020-09-05: qty 100

## 2020-09-05 MED ORDER — FAMOTIDINE 20 MG PO TABS: 20.0000 mg | ORAL_TABLET | Freq: Once | ORAL | Status: AC

## 2020-09-05 MED ORDER — SUGAMMADEX SODIUM 500 MG/5ML IV SOLN
INTRAVENOUS | Status: DC | PRN
  Administered 2020-09-05: 500 mg via INTRAVENOUS

## 2020-09-05 MED ORDER — OXYMETAZOLINE HCL 0.05 % NA SOLN: 2.0000 | Freq: Once | NASAL | Status: AC

## 2020-09-05 MED ORDER — ONDANSETRON HCL 4 MG/2ML IJ SOLN
INTRAMUSCULAR | Status: DC | PRN
  Administered 2020-09-05 (×2): 4 mg via INTRAVENOUS

## 2020-09-05 MED ORDER — PHENYLEPHRINE HCL 10 % OP SOLN
OPHTHALMIC | Status: DC | PRN
  Administered 2020-09-05: 10 mL via TOPICAL

## 2020-09-05 MED ORDER — GLYCOPYRROLATE 0.2 MG/ML IJ SOLN
INTRAMUSCULAR | Status: DC | PRN
  Administered 2020-09-05: .2 mg via INTRAVENOUS

## 2020-09-05 MED ORDER — MIDAZOLAM HCL 2 MG/2ML IJ SOLN
INTRAMUSCULAR | Status: AC
  Filled 2020-09-05: qty 2

## 2020-09-05 MED ORDER — HYDROMORPHONE HCL 1 MG/ML IJ SOLN
INTRAMUSCULAR | Status: AC
  Filled 2020-09-05: qty 1

## 2020-09-05 MED ORDER — MEPERIDINE HCL 25 MG/ML IJ SOLN: 6.2500 mg | INTRAMUSCULAR | Status: DC | PRN

## 2020-09-05 MED ORDER — PROPOFOL 10 MG/ML IV BOLUS
INTRAVENOUS | Status: AC
  Filled 2020-09-05: qty 20

## 2020-09-05 MED ORDER — FAMOTIDINE 20 MG PO TABS
ORAL_TABLET | ORAL | Status: AC
  Administered 2020-09-05: 20 mg via ORAL
  Filled 2020-09-05: qty 1

## 2020-09-05 MED ORDER — LIDOCAINE-EPINEPHRINE (PF) 1 %-1:200000 IJ SOLN
INTRAMUSCULAR | Status: DC | PRN
  Administered 2020-09-05: 5 mL

## 2020-09-05 MED ORDER — ESMOLOL HCL 100 MG/10ML IV SOLN
INTRAVENOUS | Status: DC | PRN
  Administered 2020-09-05: 20 mg via INTRAVENOUS

## 2020-09-05 MED ORDER — OXYCODONE HCL 5 MG PO TABS
ORAL_TABLET | ORAL | Status: AC
  Filled 2020-09-05: qty 1

## 2020-09-05 MED ORDER — LIDOCAINE HCL (PF) 4 % IJ SOLN
INTRAMUSCULAR | Status: AC
  Filled 2020-09-05: qty 10

## 2020-09-05 MED ORDER — HYDROMORPHONE HCL 1 MG/ML IJ SOLN
INTRAMUSCULAR | Status: DC | PRN
  Administered 2020-09-05 (×2): .5 mg via INTRAVENOUS

## 2020-09-05 MED ORDER — LIDOCAINE HCL (PF) 2 % IJ SOLN
INTRAMUSCULAR | Status: AC
  Filled 2020-09-05: qty 15

## 2020-09-05 MED ORDER — PROMETHAZINE HCL 25 MG/ML IJ SOLN: 6.2500 mg | INTRAMUSCULAR | Status: DC | PRN

## 2020-09-05 MED ORDER — CHLORHEXIDINE GLUCONATE 0.12 % MT SOLN: 15.0000 mL | Freq: Once | OROMUCOSAL | Status: AC

## 2020-09-05 MED ORDER — DEXAMETHASONE SODIUM PHOSPHATE 10 MG/ML IJ SOLN
INTRAMUSCULAR | Status: DC | PRN
  Administered 2020-09-05: 10 mg via INTRAVENOUS

## 2020-09-05 MED ORDER — PHENYLEPHRINE HCL (PRESSORS) 10 MG/ML IV SOLN
INTRAVENOUS | Status: AC
  Filled 2020-09-05: qty 1

## 2020-09-05 MED ORDER — CEFAZOLIN SODIUM-DEXTROSE 2-4 GM/100ML-% IV SOLN
2.0000 g | Freq: Once | INTRAVENOUS | Status: AC
  Administered 2020-09-05: 2 g via INTRAVENOUS

## 2020-09-05 MED ORDER — ACETAMINOPHEN 10 MG/ML IV SOLN
INTRAVENOUS | Status: DC | PRN
  Administered 2020-09-05: 1000 mg via INTRAVENOUS

## 2020-09-05 MED ORDER — ORAL CARE MOUTH RINSE: 15.0000 mL | Freq: Once | OROMUCOSAL | Status: AC

## 2020-09-05 MED ORDER — PROPOFOL 10 MG/ML IV BOLUS
INTRAVENOUS | Status: DC | PRN
  Administered 2020-09-05: 100 mg via INTRAVENOUS

## 2020-09-05 MED ORDER — ROCURONIUM BROMIDE 100 MG/10ML IV SOLN
INTRAVENOUS | Status: DC | PRN
  Administered 2020-09-05: 50 mg via INTRAVENOUS
  Administered 2020-09-05: 40 mg via INTRAVENOUS

## 2020-09-05 MED ORDER — LIDOCAINE-EPINEPHRINE 1 %-1:100000 IJ SOLN
INTRAMUSCULAR | Status: AC
  Filled 2020-09-05: qty 1

## 2020-09-05 MED ORDER — MIDAZOLAM HCL 2 MG/2ML IJ SOLN
INTRAMUSCULAR | Status: DC | PRN
  Administered 2020-09-05: 2 mg via INTRAVENOUS

## 2020-09-05 MED ORDER — LIDOCAINE HCL (CARDIAC) PF 100 MG/5ML IV SOSY
PREFILLED_SYRINGE | INTRAVENOUS | Status: DC | PRN
  Administered 2020-09-05: 100 mg via INTRAVENOUS

## 2020-09-05 MED ORDER — LACTATED RINGERS IV SOLN: INTRAVENOUS | Status: DC

## 2020-09-05 MED ORDER — PREDNISONE 10 MG PO TABS: ORAL_TABLET | ORAL | 0 refills | Status: AC

## 2020-09-05 MED ORDER — OXYCODONE HCL 5 MG PO TABS
5.0000 mg | ORAL_TABLET | Freq: Once | ORAL | Status: AC | PRN
  Administered 2020-09-05: 5 mg via ORAL

## 2020-09-05 MED ORDER — PROPOFOL 500 MG/50ML IV EMUL
INTRAVENOUS | Status: AC
  Filled 2020-09-05: qty 50

## 2020-09-05 MED ORDER — FENTANYL CITRATE (PF) 100 MCG/2ML IJ SOLN: 25.0000 ug | INTRAMUSCULAR | Status: DC | PRN

## 2020-09-05 MED ORDER — CEPHALEXIN 500 MG PO CAPS: 500.0000 mg | ORAL_CAPSULE | Freq: Two times a day (BID) | ORAL | 0 refills | Status: AC

## 2020-09-05 MED ORDER — FENTANYL CITRATE (PF) 100 MCG/2ML IJ SOLN
INTRAMUSCULAR | Status: DC | PRN
  Administered 2020-09-05 (×2): 50 ug via INTRAVENOUS

## 2020-09-05 MED ORDER — OXYMETAZOLINE HCL 0.05 % NA SOLN
NASAL | Status: AC
  Administered 2020-09-05: 2 via NASAL
  Filled 2020-09-05: qty 30

## 2020-09-05 MED ORDER — HYDROCODONE-ACETAMINOPHEN 5-325 MG PO TABS: 1.0000 | ORAL_TABLET | Freq: Four times a day (QID) | ORAL | 0 refills | Status: AC | PRN

## 2020-09-05 SURGICAL SUPPLY — 30 items
AQUAPLAST 3X3 FLAT (MISCELLANEOUS) ×2
BLADE SURG 15 STRL LF DISP TIS (BLADE) ×1 IMPLANT
BLADE SURG 15 STRL SS (BLADE) ×1
CANISTER SUCT 1200ML W/VALVE (MISCELLANEOUS) ×1 IMPLANT
COAG SUCT 10F 3.5MM HAND CTRL (MISCELLANEOUS) ×2 IMPLANT
COVER WAND RF STERILE (DRAPES) ×2 IMPLANT
ELECT REM PT RETURN 9FT ADLT (ELECTROSURGICAL) ×2
ELECTRODE REM PT RTRN 9FT ADLT (ELECTROSURGICAL) ×1 IMPLANT
GLOVE PROTEXIS LATEX SZ 7.5 (GLOVE) ×4 IMPLANT
GLOVE SURG LATEX 7.5 PF (GLOVE) ×2 IMPLANT
GOWN STRL REUS W/ TWL LRG LVL3 (GOWN DISPOSABLE) ×2 IMPLANT
GOWN STRL REUS W/TWL LRG LVL3 (GOWN DISPOSABLE) ×2
LABEL OR SOLS (LABEL) ×2 IMPLANT
MANIFOLD NEPTUNE II (INSTRUMENTS) ×2 IMPLANT
NDL ANESTHESIA 27G X 3.5 (NEEDLE) ×1 IMPLANT
NEEDLE ANESTHESIA  27G X 3.5 (NEEDLE) ×1
NEEDLE ANESTHESIA 27G X 3.5 (NEEDLE) ×1 IMPLANT
NS IRRIG 500ML POUR BTL (IV SOLUTION) ×2 IMPLANT
PACK HEAD/NECK (MISCELLANEOUS) ×2 IMPLANT
PATTIES SURGICAL .5 X3 (DISPOSABLE) ×2 IMPLANT
SPLINT AQUAPLAST 3X3 FLAT (MISCELLANEOUS) IMPLANT
SPLINT NASAL REUTER .5MM BIVLV (MISCELLANEOUS) ×2 IMPLANT
SPONGE NEURO XRAY DETECT 1X3 (DISPOSABLE) ×1 IMPLANT
STRIP CLOSURE SKIN 1/2X4 (GAUZE/BANDAGES/DRESSINGS) ×2 IMPLANT
SUT CHROMIC 3-0 (SUTURE) ×1
SUT CHROMIC 3-0 KS 27XMFL CR (SUTURE) ×1
SUT ETHILON 3-0 KS 30 BLK (SUTURE) ×2 IMPLANT
SUT PLAIN GUT 4-0 (SUTURE) ×2 IMPLANT
SUTURE CHRMC 3-0 KS 27XMFL CR (SUTURE) ×1 IMPLANT
SYR 3ML LL SCALE MARK (SYRINGE) ×2 IMPLANT

## 2020-09-05 NOTE — H&P (Signed)
H&P has been reviewed and patient reevaluated, no changes necessary. To be downloaded later.  

## 2020-09-05 NOTE — Anesthesia Preprocedure Evaluation (Addendum)
Anesthesia Evaluation  Patient identified by MRN, date of birth, ID band Patient awake    Reviewed: Allergy & Precautions, NPO status , Patient's Chart, lab work & pertinent test results  History of Anesthesia Complications Negative for: history of anesthetic complications  Airway Mallampati: II  TM Distance: >3 FB Neck ROM: Full    Dental  (+) Implants   Pulmonary neg pulmonary ROS, neg sleep apnea, neg COPD,    breath sounds clear to auscultation- rhonchi (-) wheezing      Cardiovascular Exercise Tolerance: Good (-) hypertension(-) CAD and (-) Past MI  Rhythm:Regular Rate:Normal - Systolic murmurs and - Diastolic murmurs    Neuro/Psych negative neurological ROS  negative psych ROS   GI/Hepatic negative GI ROS, Neg liver ROS,   Endo/Other  negative endocrine ROSneg diabetes  Renal/GU negative Renal ROS     Musculoskeletal negative musculoskeletal ROS (+)   Abdominal (+) + obese,   Peds  Hematology negative hematology ROS (+)   Anesthesia Other Findings   Reproductive/Obstetrics                             Anesthesia Physical Anesthesia Plan  ASA: II  Anesthesia Plan: General   Post-op Pain Management:    Induction: Intravenous  PONV Risk Score and Plan: 1 and Ondansetron, Dexamethasone and Midazolam  Airway Management Planned: Oral ETT  Additional Equipment:   Intra-op Plan:   Post-operative Plan: Extubation in OR  Informed Consent: I have reviewed the patients History and Physical, chart, labs and discussed the procedure including the risks, benefits and alternatives for the proposed anesthesia with the patient or authorized representative who has indicated his/her understanding and acceptance.     Dental advisory given  Plan Discussed with: CRNA and Anesthesiologist  Anesthesia Plan Comments:         Anesthesia Quick Evaluation

## 2020-09-05 NOTE — Transfer of Care (Signed)
Immediate Anesthesia Transfer of Care Note  Patient: Louis Shah  Procedure(s) Performed: CLOSED REDUCTION NASAL FRACTURE (N/A ) SEPTOPLASTY (N/A )  Patient Location: PACU  Anesthesia Type:General  Level of Consciousness: drowsy  Airway & Oxygen Therapy: Patient Spontanous Breathing and Patient connected to face mask oxygen  Post-op Assessment: Report given to RN  Post vital signs: stable  Last Vitals:  Vitals Value Taken Time  BP    Temp    Pulse 72 09/05/20 0844  Resp    SpO2 96 % 09/05/20 0844  Vitals shown include unvalidated device data.  Last Pain:  Vitals:   09/05/20 0706  TempSrc: Temporal  PainSc: 0-No pain      Patients Stated Pain Goal: 0 (09/05/20 0706)  Complications: No complications documented.

## 2020-09-05 NOTE — Anesthesia Procedure Notes (Signed)
Procedure Name: Intubation Performed by: Mohammed Kindle, CRNA Pre-anesthesia Checklist: Patient identified, Emergency Drugs available, Suction available and Patient being monitored Patient Re-evaluated:Patient Re-evaluated prior to induction Oxygen Delivery Method: Circle system utilized Preoxygenation: Pre-oxygenation with 100% oxygen Induction Type: IV induction Ventilation: Mask ventilation without difficulty Laryngoscope Size: McGraph and 3 Grade View: Grade I Tube type: Oral Rae Number of attempts: 1 Airway Equipment and Method: Stylet and Oral airway Placement Confirmation: ETT inserted through vocal cords under direct vision,  positive ETCO2,  breath sounds checked- equal and bilateral and CO2 detector Tube secured with: Tape Dental Injury: Teeth and Oropharynx as per pre-operative assessment

## 2020-09-05 NOTE — Op Note (Signed)
09/05/2020  8:38 AM  [id number]   Pre-Op Dx:  Deviated Nasal Septum, displaced nasal fracture  Post-op Dx: Same  Proc: Nasal Septoplasty, closed reduction nasal fracture  Surg:  [facility] [place]  Anes:  GOT  EBL: 50 mL  Comp: None  Findings: Nasal bones displaced to the left at the nasal dorsum.  His right nasal bone was infractured and left nasal bone outfractured.  This pulled the septum superiorly off the left side as well.  A large inferior spur on the right side with a very deviated vomer posteriorly.  Procedure: With the patient in a comfortable supine position,  general orotracheal anesthesia was induced without difficulty.     The patient received preoperative Afrin spray for topical decongestion and vasoconstriction.  Intravenous prophylactic antibiotics were administered.  At an appropriate level, the patient was placed in a semi-sitting position.  Nasal vibrissae were trimmed.   1% Xylocaine with 1:100,000 epinephrine, 5 cc's, was infiltrated into the anterior floor of the nose, into the nasal spine region, into the membranous columella, and finally into the submucoperichondrial plane of the septum on both sides.  Several minutes were allowed for this to take effect.  Cottoniod pledgetts soaked in Afrin and 4% Xylocaine were placed into both nasal cavities and left while the patient was prepped and draped in the standard fashion.  The materials were removed from the nose and observed to be intact and correct in number.  The nose was inspected with a headlight and with the findings as described above.  A left Killian incision was sharply executed and carried down to the quadrangular cartilage. The mucoperichondrium was elelvated along the quadrangular plate back to the bony-cartilaginous junction. The mucoperiostium was then elevated along the ethmoid plate and the vomer. The boney-catilaginous junction was then split with a freer elevator and the mucoperiosteum was elevated on  the opposite side. The mucoperiosteum was then elevated along the maxillary crest as needed to expose the crooked bone of the crest.  Boney spurs of the vomer and maxillary crest were removed with Lenoria Chime forceps.  The cartilaginous plate was trimmed along its posterior and inferior borders of about 2 mm of cartilage to free it up inferiorly. Some of the deviated ethmoid plate was then fractured and removed with Takahashi forceps to free up the posterior border of the quadrangular plate and allow it to swing back to the midline. The mucosal flaps were placed back into their anatomic position to allow visualization of the airways. The septum now sat in the midline with an improved airway.  A 3-0 Chromic suture on a Keith needle in used to anchor the inferior septum at the nasal spine with a through and through suture. The mucosal flaps are then sutured together using a through and through whip stitch of 4-0 [place] with a mini-Keith needle. This was used to close the [place] incision as well.   The nasal bones were then inspected and were deviated to the left as noted above.  A [place] elevator was used to elevate the right nasal bone and pull it back to the right side.  Digital pressure was placed on the nasal dorsum and this then loosened up the nasal bones and allowed them to moved back towards the midline.  Nasal bones were loose across the top but were sitting without any pressure on them in the midline.  The nasal dorsum was then covered with Steri-Strips.  An Aquaplast cast was then shaped and softened and placed over  the nasal dorsum.  This was cool and allowed to harden and taped down to the skin.  This left the nose now sitting in the midline.  The airways were then visualized and showed open passageways on both sides that were significantly improved compared to before surgery. There was no signifcant bleeding. Nasal splints were applied to both sides of the septum using Xomed 0.55mm regular sized  splints that were trimmed, and then held in position with a 3-0 Nylon through and through suture.  The patient was turned back over to anesthesia, and awakened, extubated, and taken to the PACU in satisfactory condition.  Dispo:   PACU to home  Plan: Ice, elevation, narcotic analgesia, steroid taper, and prophylactic antibiotics for the duration of indwelling nasal foreign bodies.  We will reevaluate the patient in the office in 6 days and remove the septal splints and cast.  Return to work in 14 days, strenuous activities in two weeks.   Cammy Copa 09/05/2020 8:38 AM

## 2020-09-05 NOTE — Discharge Instructions (Signed)
Fractura nasal Nasal Fracture Una fractura es la ruptura de un hueso. Neomia Dear fractura nasal es Trudee Kuster quebrada. Las fracturas menores no requieren TEFL teacher. Habitualmente se curan por s solas en aproximadamente un mes. Las fracturas graves pueden Network engineer. En algunos casos se requiere [place]. Cules son las causas? Generalmente, la causa de esta afeccin es un golpe directo a la nariz (traumatismo cerrado). Esto se produce a menudo por:  Education administrator un deporte de contacto.  Tener un accidente automovilstico.  [place].  Puetazos. Cules son los signos o los sntomas?  Dolor.  Hinchazn de la nariz.  Hemorragia nasal.  Moretones alrededor de la nariz o alrededor de los ojos (ojos amoratados).  Nariz torcida. Cmo se trata? El tratamiento depende de la gravedad de la lesin.  Las fracturas menores no suelen requerir TEFL teacher.  En el caso de las fracturas ms graves que produjeron un desplazamiento de los [place], [place] tratamiento puede incluir uno de estos: ? Programme researcher, broadcasting/film/video a Land en la posicin correcta sin realizar [place]. Es posible que el mdico pueda realizar este procedimiento en el consultorio despus de administrarle un medicamento para adormecer la zona de la nariz (anestesia local). ? [place]. Si es necesario, se llevar a cabo despus de que la hinchazn haya desaparecido. Siga estas indicaciones en su casa: Actividad  Retome sus actividades habituales como se lo haya indicado el mdico. Pregntele al mdico qu actividades son seguras para usted.  No practique deportes de contacto durante 3 o 4semanas, o como se lo haya indicado el mdico. Indicaciones generales  Aplique hielo sobre la zona lesionada, si se lo indican: ? Ponga el hielo en una bolsa plstica. ? Coloque una [employer] piel y Copy. ? Coloque el hielo durante , 2 a 3veces por da.  Tome los medicamentos de venta libre y los recetados solamente  como se lo haya indicado el mdico.  Si le sangra la nariz, sintese erguido [place] se aprieta la nariz durante .  Intente no sonarse la nariz.  Concurra a todas las visitas de [address] se lo haya indicado el mdico. Esto es importante.      Comunquese con un mdico si:  Aumenta el dolor o se vuelve muy intenso.  Sigue teniendo hemorragias nasales.  La forma de la nariz no vuelve a la normalidad despus de 5das.  Le sale pus de la Clinical cytogeneticist. Solicite ayuda inmediatamente si:  La nariz le sangra durante ms de .  Le sale un lquido transparente de la Clinical cytogeneticist.  Tiene una hinchazn en el interior de la nariz que no mejora.  Tiene problemas para mover los ojos.  No deja de vomitar. Resumen  Neomia Dear fractura nasal es Trudee Kuster quebrada.  Los sntomas incluyen dolor, hinchazn y moretones.  Las fracturas menores no suelen requerir TEFL teacher. Las fracturas ms graves pueden requerir [place] u otros tratamientos.  Si le sangra la nariz, sintese erguido [place] se aprieta la nariz durante . Esta informacin no tiene Theme park manager el consejo del mdico. Asegrese de hacerle al mdico cualquier pregunta que tenga. Document Revised: 10/16/2017 Document Reviewed: 10/16/2017 Elsevier Patient Education  2021 Elsevier Inc.    Nasal Fracture A fracture is a break in a bone. A nasal fracture is a broken nose. Minor breaks do not need treatment. They often heal on their own in about one month. Serious breaks may need treatment. Sometimes surgery is needed. What are the causes? This condition is usually caused by a direct hit to  the nose (blunt injury). This often occurs from:  Playing a contact sport.  Being in a car accident.  Falling.  Getting punched. What are the signs or symptoms?  Pain.  Swelling of the nose.  Bleeding from the nose.  Bruising around the nose or bruising around the eyes (black eyes).  The nose having a  crooked shape. How is this treated? Treatment depends on how bad the injury is.  Minor breaks often do not need treatment.  For more serious breaks that have caused bones to move out of position, treatment may involve one of these: ? Moving the bones back into position without surgery. Your doctor may be able to do this in his or her office after you are given medicine to numb the nose area (local anesthetic). ? Surgery. If needed, this will be done after the swelling is gone. Follow these instructions at home: Activity  Return to your normal activities as told by your doctor. Ask your doctor what activities are safe for you.  Do not play contact sports for 3-4 weeks or as told by your doctor. General instructions  If told, put ice on the injured area: ? Put ice in a plastic bag. ? Place a towel between your skin and the bag. ? Leave the ice on for 20 minutes, 2-3 times a day.  Take over-the-counter and prescription medicines only as told by your doctor.  If your nose bleeds, sit up while you gently squeeze your nose shut for 10 minutes.  Try to not blow your nose.  Keep all follow-up visits as told by your doctor. This is important.      Contact a doctor if:  You have more pain or very bad pain.  You keep having nosebleeds.  The shape of your nose does not return to normal after 5 days.  You have pus coming out of your nose. Get help right away if:  Your nose bleeds for more than 20 minutes.  You have clear fluid draining out of your nose.  You have a swelling on the inside of your nose that does not get better.  You have trouble moving your eyes.  You keep throwing up (vomiting). Summary  A nasal fracture is a broken nose.  Symptoms include pain, swelling, and bruising.  Minor breaks often do not require treatment. More serious breaks may require surgery or other treatments.  If your nose bleeds, sit up while you gently squeeze your nose shut for 10  minutes. This information is not intended to replace advice given to you by your health care provider. Make sure you discuss any questions you have with your health care provider. Document Revised: 09/09/2017 Document Reviewed: 09/09/2017 Elsevier Patient Education  2021 Elsevier Inc.     CIRUGIA AMBULATORIA       Instruccionnes de alta    Date Franco Nones)    1.  Las drogas que se Dispensing optician en su cuerpo [employer], asi            que por las proximas 24 horas usted no debe:   Conducir Field seismologist) un automovil   Hacer ninguna decision legal   Tomar ninguna bebida alcoholica  2.  A) [place] puede comenzar una dieta regular.  Es mejor que hoy empiece con           liquidos y gradualmente anada [address]       B) Puede comer cualquier comida que desee pero es mejor empezar con liquidos,  luego sopitas con galletas saladas y gradualmente llegar a las comidas solidas.  3.  Por favor avise a su medico inmediatamente si usted tiene algun sangrado anormal,       tiene dificultad con la respiracion, enrojecimiento y Engineer, mining en el sitio de la cirugia, [place],       fiebro o dolor que se alivia con [place].  4.  A) Su visita posoperatoria (despues de su operacion) es con el  Dr.   Date                  Time         B)  Por favor llame para hacer la cita posoperatoria.  5.  Istrucciones especificas :

## 2020-09-05 NOTE — Anesthesia Postprocedure Evaluation (Signed)
Anesthesia Post Note  Patient: Louis Shah  Procedure(s) Performed: CLOSED REDUCTION NASAL FRACTURE (N/A ) SEPTOPLASTY (N/A )  Patient location during evaluation: PACU Anesthesia Type: General Level of consciousness: awake and alert and oriented Pain management: pain level controlled Vital Signs Assessment: post-procedure vital signs reviewed and stable Respiratory status: spontaneous breathing, nonlabored ventilation and respiratory function stable Cardiovascular status: blood pressure returned to baseline and stable Postop Assessment: no signs of nausea or vomiting Anesthetic complications: no   No complications documented.   Last Vitals:  Vitals:   09/05/20 0930 09/05/20 0948  BP: 121/83 116/79  Pulse: 78 72  Resp: 19 16  Temp: (!) 36.2 C 36.6 C  SpO2: 96%     Last Pain:  Vitals:   09/05/20 0948  TempSrc: Temporal  PainSc: 3                  Madlynn Lundeen

## 2022-08-20 IMAGING — CT CT HEAD W/O CM
4 series · 16 of 47 positions shown, 18 images · non-contrast
Comparison: None

CLINICAL DATA: Fell from roof from an 8 foot ladder, landed on
concrete, struck head, denies loss of consciousness, has frontal
laceration, facial trauma, denies neck pain

EXAM:
CT HEAD WITHOUT CONTRAST
CT MAXILLOFACIAL WITHOUT CONTRAST
CT CERVICAL SPINE WITHOUT CONTRAST
TECHNIQUE: Multidetector CT imaging of the head, cervical spine, and
maxillofacial structures were performed using the standard protocol
without intravenous contrast. Multiplanar CT image reconstructions
of the cervical spine and maxillofacial structures were also
generated. Right side of face marked with BB.

[Series 2: head bone · axial · 0.49mm/px · z∈[-104,-72]mm · 3 of 78 slices shown]
[im 8/78  bone]
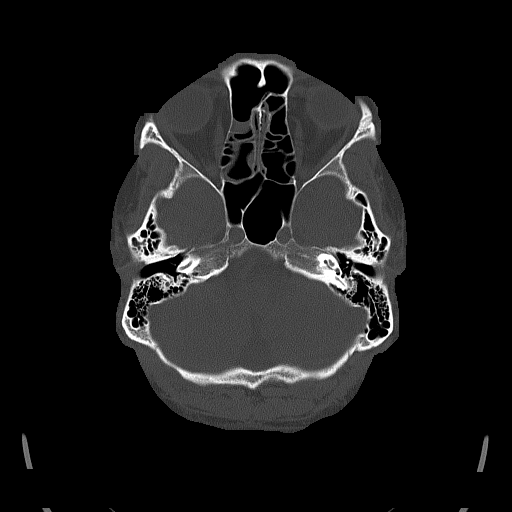
[im 16/78  bone]
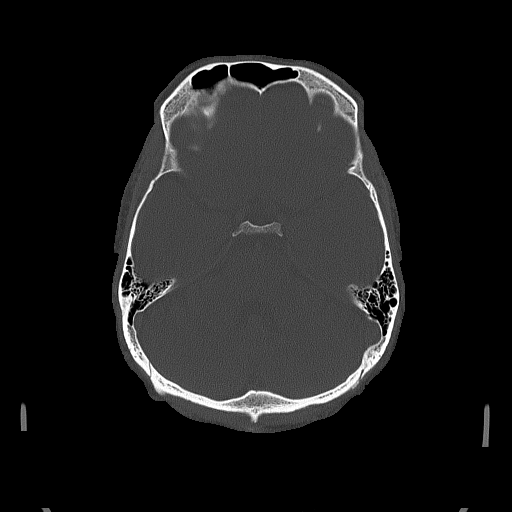
[im 24/78  bone]
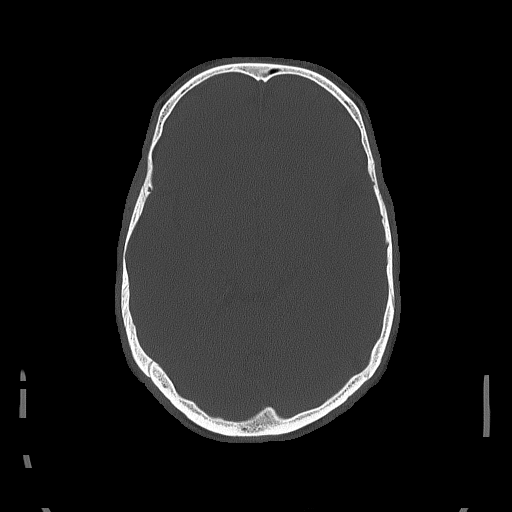

[Series 3: head wo · axial · 0.49mm/px · z∈[-103,+12]mm · 7 of 31 slices shown, 9 images]
[im 4/31  brain]
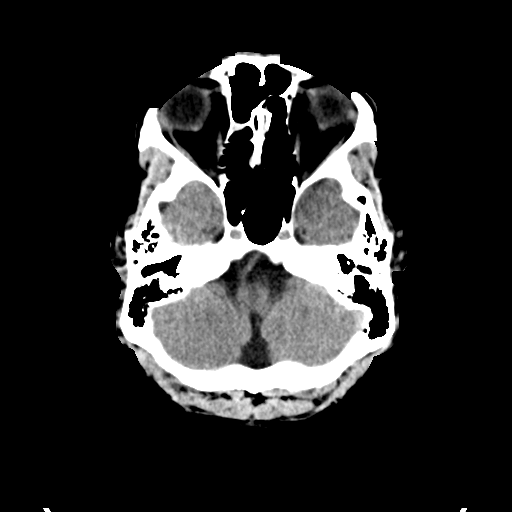
[im 4/31  bone]
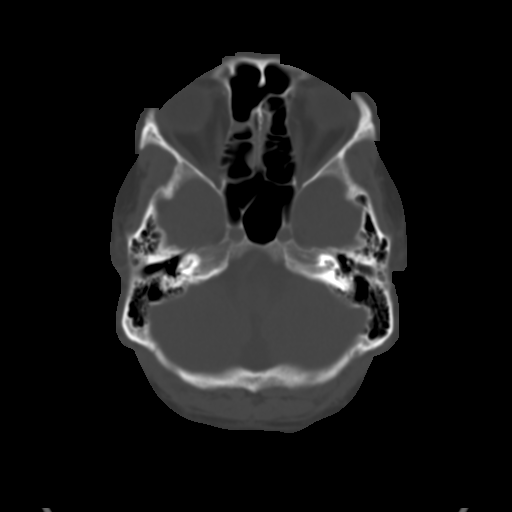
[im 8/31  brain]
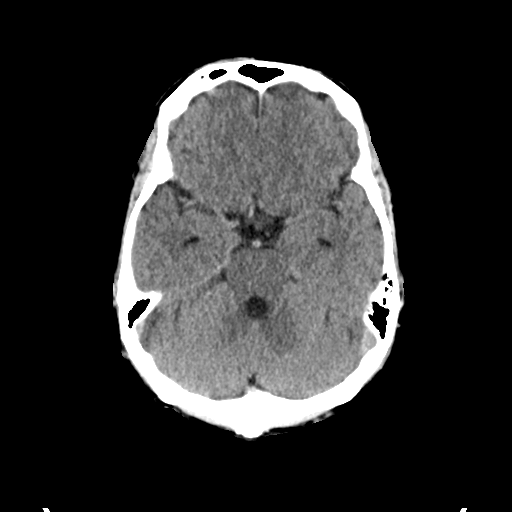
[im 12/31  brain]
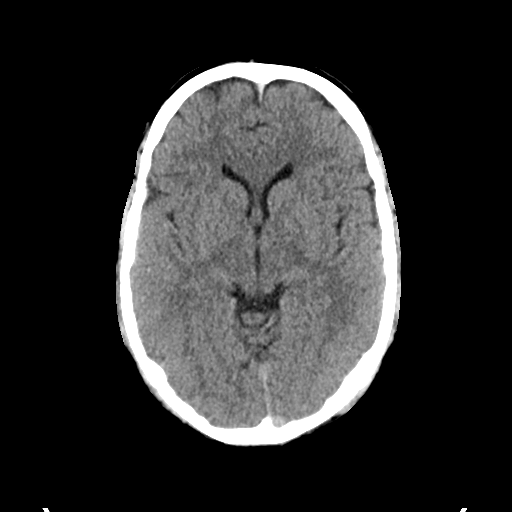
[im 16/31  brain]
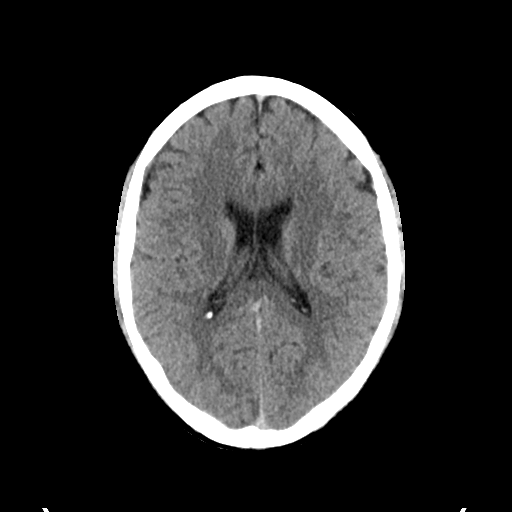
[im 19/31  brain]
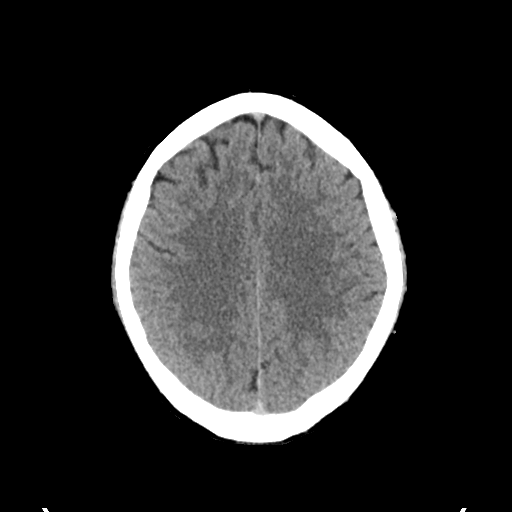
[im 19/31  bone]
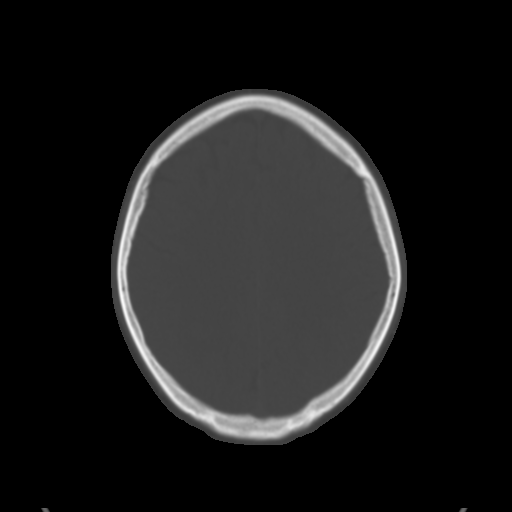
[im 23/31  brain]
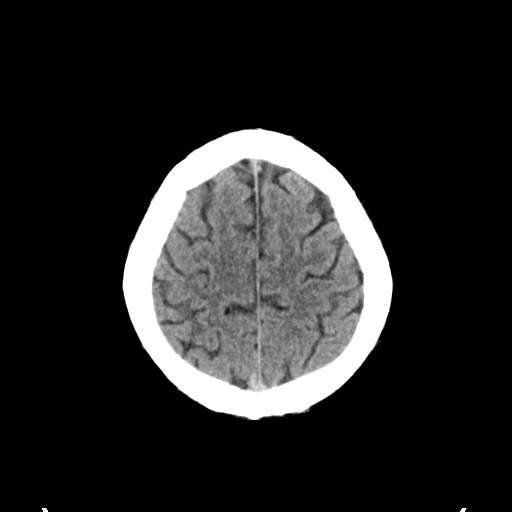
[im 27/31  brain]
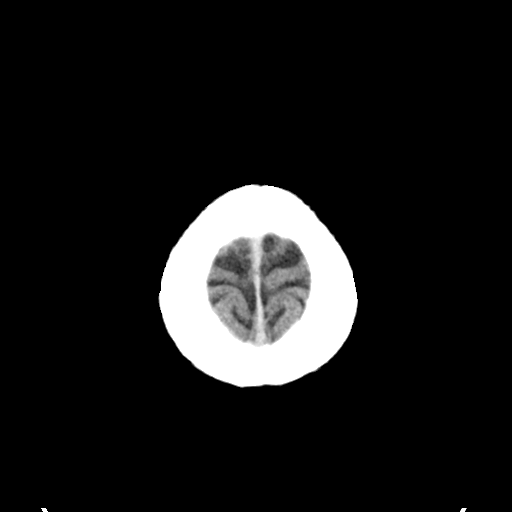

[Series 4: coronal soft tissue · coronal · 0.31mm/px · 3 of 67 slices shown]
[im 23/67  brain]
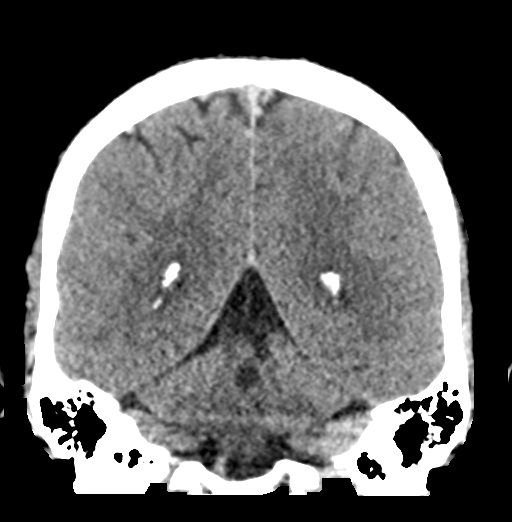
[im 30/67  brain]
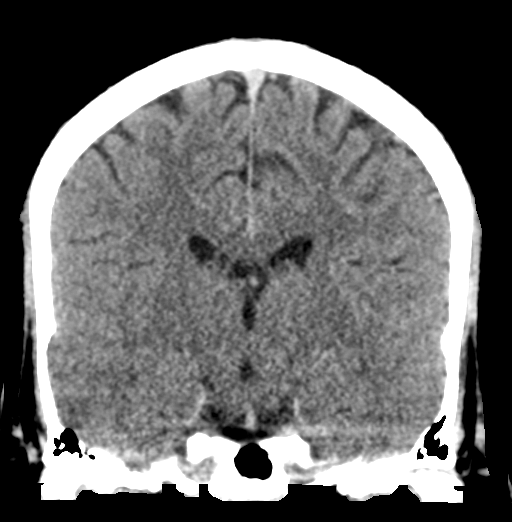
[im 37/67  brain]
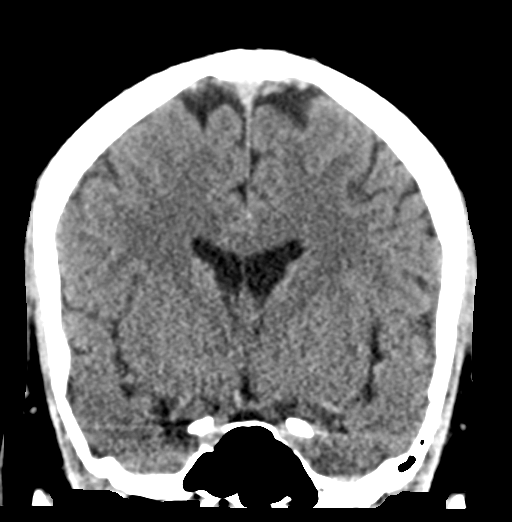

[Series 5: sagittal soft tissue · sagittal · 0.32mm/px · 3 of 54 slices shown]
[im 18/54  brain]
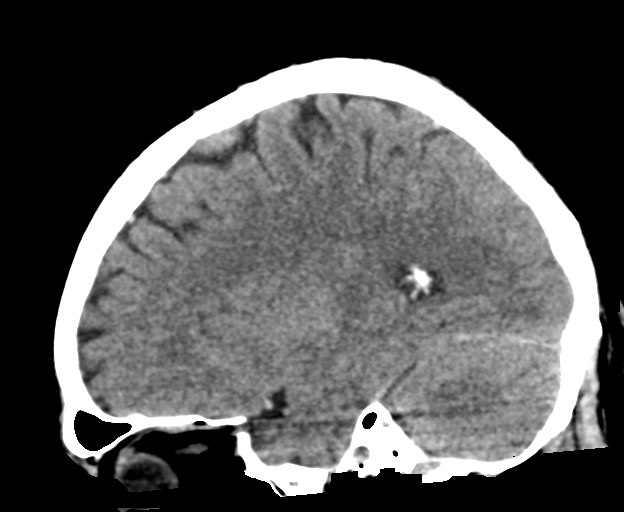
[im 27/54  brain]
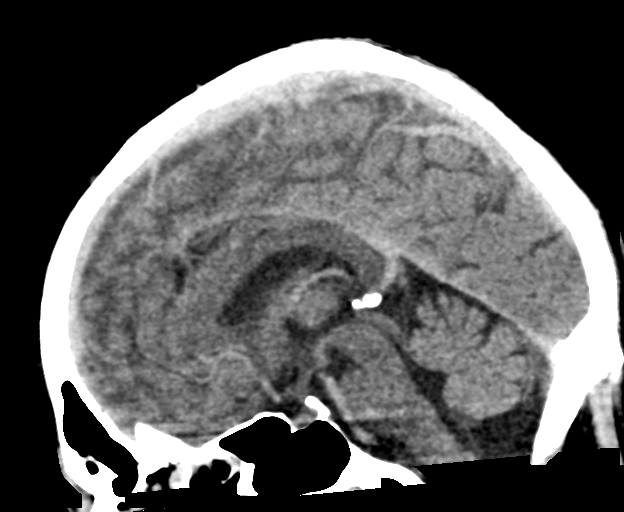
[im 36/54  brain]
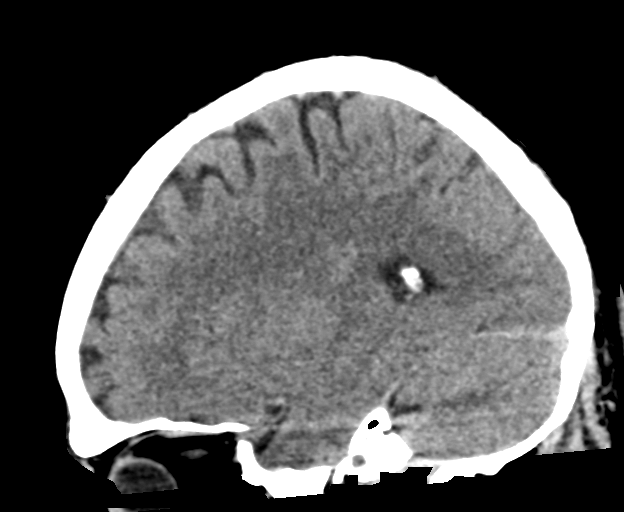

[16 of 47 positions shown; findings below may reference images not displayed]

FINDINGS: CT HEAD FINDINGS

Brain: Normal ventricular morphology. No midline shift or mass
effect. Normal appearance of brain parenchyma. No intracranial
hemorrhage, mass lesion, or evidence of acute infarction. No
extra-axial fluid collections.

Vascular: No vascular abnormalities

Skull: Minimal RIGHT frontal scalp soft tissue swelling. Calvaria
intact.

Other: N/A

CT MAXILLOFACIAL FINDINGS

Osseous: Minimally displaced LEFT nasal bone fracture. No additional
facial bone fractures identified. TMJ alignment normal bilaterally.

Orbits: Bony orbits intact. Intraorbital soft tissue planes clear
without fluid or air.

Sinuses: Scattered mucosal thickening in ethmoid air cells. Small
mucosal retention cyst RIGHT maxillary sinus with small RIGHT
maxillary air-fluid level. Mastoid air cells remaining paranasal
sinuses, and middle ear cavities clear.

Soft tissues: Small RIGHT supraorbital laceration and soft tissue
swelling/contusion. Soft tissue swelling at the nose. No additional
soft tissue abnormalities.

CT CERVICAL SPINE FINDINGS

Alignment: Normal

Skull base and vertebrae: Osseous mineralization normal. Vertebral
body and disc space heights maintained. Skull base intact. No
fracture, subluxation, or bone destruction.

Soft tissues and spinal canal: Prevertebral soft tissues normal
thickness. Remaining visualized cervical soft tissues unremarkable.

Disc levels:  No specific abnormalities

Upper chest: Lung apices clear.

Other: N/A
IMPRESSION: Normal CT head.

Minimally displaced LEFT nasal bone fracture.

No additional facial bone abnormalities.

Normal CT cervical spine.
